# Patient Record
Sex: Female | Born: 1980 | Race: White | Hispanic: No | Marital: Married | Smoking: Never smoker
Health system: Southern US, Community
[De-identification: ages and names within clinical notes are randomized; demographics above are authoritative.]

---

## 2009-05-07 ENCOUNTER — Emergency Department (HOSPITAL_COMMUNITY): Admission: EM | Admit: 2009-05-07 | Discharge: 2009-05-07 | Payer: Self-pay | Admitting: Emergency Medicine

## 2012-04-11 ENCOUNTER — Emergency Department (HOSPITAL_COMMUNITY)
Admission: EM | Admit: 2012-04-11 | Discharge: 2012-04-11 | Disposition: A | Discharge status: Home / Self Care | Payer: Self-pay | Attending: Emergency Medicine | Admitting: Emergency Medicine

## 2012-04-11 ENCOUNTER — Encounter (HOSPITAL_COMMUNITY): Payer: Self-pay | Admitting: *Deleted

## 2012-04-11 DIAGNOSIS — J329 Chronic sinusitis, unspecified: Secondary | ICD-10-CM

## 2012-04-11 DIAGNOSIS — J019 Acute sinusitis, unspecified: Secondary | ICD-10-CM | POA: Insufficient documentation

## 2012-04-11 MED ORDER — GUAIFENESIN-CODEINE 100-10 MG/5ML PO SYRP: 10.0000 mL | ORAL_SOLUTION | Freq: Three times a day (TID) | ORAL | Status: AC | PRN

## 2012-04-11 MED ORDER — AMOXICILLIN 500 MG PO CAPS: 500.0000 mg | ORAL_CAPSULE | Freq: Three times a day (TID) | ORAL | Status: DC

## 2012-04-11 NOTE — ED Notes (Signed)
Sore throat, nasal congestion for 4-5 days.  Alert, NAD No cough

## 2012-04-11 NOTE — ED Provider Notes (Signed)
History     CSN: [id number]  Arrival date & time 04/11/12  1845   First MD Initiated Contact with Patient 04/11/12 1900      Chief Complaint  Patient presents with  . Nasal Congestion    (Consider location/radiation/quality/duration/timing/severity/associated sxs/prior treatment) Patient is a 32 y.o. female presenting with URI. The history is provided by the patient.  URI The primary symptoms include sore throat. Primary symptoms do not include fever, headaches, ear pain, swollen glands, cough, wheezing, abdominal pain, nausea, vomiting, myalgias, arthralgias or rash. Primary symptoms comment: sinus pressure and nasal congestion The current episode started 3 to 5 days ago. This is a new problem. The problem has not changed since onset. The sore throat began yesterday. The sore throat has been unchanged since its onset. The sore throat is mild in intensity. The sore throat is not accompanied by trouble swallowing, drooling, hoarse voice or stridor.  Symptoms associated with the illness include facial pain, sinus pressure, congestion and rhinorrhea. The illness is not associated with chills or plugged ear sensation. The following treatments were addressed: Acetaminophen was not tried. A decongestant was ineffective. Aspirin was not tried. NSAIDs were not tried.    History reviewed. No pertinent past medical history.  History reviewed. No pertinent past surgical history.  No family history on file.  History  Substance Use Topics  . Smoking status: Never Smoker   . Smokeless tobacco: Not on file  . Alcohol Use: Yes     Comment: occasional    OB History    Grav Para Term Preterm Abortions TAB SAB Ect Mult Living                  Review of Systems  Constitutional: Negative for fever, chills, activity change and appetite change.  HENT: Positive for congestion, sore throat, rhinorrhea and sinus pressure. Negative for ear pain, hoarse voice, facial swelling, sneezing, drooling,  trouble swallowing, neck pain and neck stiffness.   Eyes: Negative for visual disturbance.  Respiratory: Negative for cough, chest tightness, shortness of breath, wheezing and stridor.   Gastrointestinal: Negative for nausea, vomiting and abdominal pain.  Musculoskeletal: Negative for myalgias and arthralgias.  Skin: Negative.  Negative for rash.  Neurological: Negative for dizziness, weakness, numbness and headaches.  Hematological: Negative for adenopathy.  Psychiatric/Behavioral: Negative for confusion.  All other systems reviewed and are negative.    Allergies  Review of patient's allergies indicates no known allergies.  Home Medications   Current Outpatient Rx  Name  Route  Sig  Dispense  Refill  . PHENYLEPHRINE-IBUPROFEN 10-200 MG PO TABS   Oral   Take 1-2 tablets by mouth once as needed. For congestion relief         . DAYQUIL/NYQUIL COLD/FLU RELIEF PO   Oral   Take 2 capsules by mouth daily as needed. Cold and congestion relief         . PSEUDOEPHEDRINE-ACETAMINOPHEN 30-325 MG PO TABS   Oral   Take 1-2 tablets by mouth daily as needed. For congestion relief           BP 138/68  Pulse 81  Temp 98.7 F (37.1 C) (Oral)  Resp 20  Ht 5\' 8"  (1.727 m)  Wt 380 lb (172.367 kg)  BMI 57.78 kg/m2  SpO2 99%  LMP 02/10/2012  Physical Exam  Nursing note and vitals reviewed. Constitutional: She is oriented to person, place, and time. She appears well-developed and well-nourished. No distress.  HENT:  Head: Normocephalic and atraumatic.  Right Ear: Tympanic membrane and ear canal normal.  Left Ear: Tympanic membrane and ear canal normal.  Nose: Mucosal edema and rhinorrhea present. Right sinus exhibits maxillary sinus tenderness. Right sinus exhibits no frontal sinus tenderness. Left sinus exhibits maxillary sinus tenderness. Left sinus exhibits no frontal sinus tenderness.  Mouth/Throat: Uvula is midline, oropharynx is clear and moist and mucous membranes are  normal.  Eyes: EOM are normal. Pupils are equal, round, and reactive to light.  Neck: Normal range of motion. Neck supple.  Cardiovascular: Normal rate, normal heart sounds and intact distal pulses.   No murmur heard. Pulmonary/Chest: Breath sounds normal. She is in respiratory distress. She has no wheezes. She has no rales.  Musculoskeletal: Normal range of motion.  Lymphadenopathy:    She has no cervical adenopathy.  Neurological: She is alert and oriented to person, place, and time. She exhibits normal muscle tone. Coordination normal.  Skin: Skin is warm and dry.    ED Course  Procedures (including critical care time)  Labs Reviewed - No data to display No results found.      MDM   Vitals are stable, NAD.  Nasal congestion with ttp of the maxillary sinuses.  No focal neuro deficits, no meningeal signs.    Non-toxic appearing.    Pt agrees to fluids, ibuprofen, and f/u with her PMD  Prescribed: Amoxil Robitussin AC   Pattie Flaharty L. Johneisha Broaden, [place] 04/12/12 0045

## 2012-04-11 NOTE — ED Notes (Signed)
Nasal congestion x 4 days.  Taking OTC meds with no relief.

## 2012-04-12 NOTE — ED Provider Notes (Signed)
Medical screening examination/treatment/procedure(s) were performed by non-physician practitioner and as supervising physician I was immediately available for consultation/collaboration.    Shelda Jakes, MD 04/12/12 1200

## 2012-05-01 ENCOUNTER — Emergency Department (HOSPITAL_COMMUNITY)
Admission: EM | Admit: 2012-05-01 | Discharge: 2012-05-01 | Disposition: A | Discharge status: Home / Self Care | Payer: Self-pay | Attending: Emergency Medicine | Admitting: Emergency Medicine

## 2012-05-01 ENCOUNTER — Encounter (HOSPITAL_COMMUNITY): Payer: Self-pay | Admitting: *Deleted

## 2012-05-01 DIAGNOSIS — Y9389 Activity, other specified: Secondary | ICD-10-CM | POA: Insufficient documentation

## 2012-05-01 DIAGNOSIS — Y929 Unspecified place or not applicable: Secondary | ICD-10-CM | POA: Insufficient documentation

## 2012-05-01 DIAGNOSIS — S025XXA Fracture of tooth (traumatic), initial encounter for closed fracture: Secondary | ICD-10-CM | POA: Insufficient documentation

## 2012-05-01 DIAGNOSIS — X58XXXA Exposure to other specified factors, initial encounter: Secondary | ICD-10-CM | POA: Insufficient documentation

## 2012-05-01 MED ORDER — HYDROCODONE-ACETAMINOPHEN 5-325 MG PO TABS: 2.0000 | ORAL_TABLET | ORAL | Status: DC | PRN

## 2012-05-01 MED ORDER — NAPROXEN 500 MG PO TABS: 500.0000 mg | ORAL_TABLET | Freq: Two times a day (BID) | ORAL | Status: AC

## 2012-05-01 MED ORDER — AMOXICILLIN 500 MG PO CAPS: 500.0000 mg | ORAL_CAPSULE | Freq: Three times a day (TID) | ORAL | Status: AC

## 2012-05-01 NOTE — ED Notes (Signed)
Pt has pain to one of her teeth on the botttom right side of her mouth. Can't see dentist until Tuesday

## 2012-05-01 NOTE — ED Provider Notes (Signed)
History     CSN: [id number]  Arrival date & time 05/01/12  2017   First MD Initiated Contact with Patient 05/01/12 2047      Chief Complaint  Patient presents with  . Dental Pain    (Consider location/radiation/quality/duration/timing/severity/associated sxs/prior treatment) HPI Comments: Pt with cc of dental pain - has had toothache X e days since she was chwing on hard candy and broke her tooth.  Sx were acute in osnet, constant, worse with chewing and sensitivity to temp, not associated with f/c/n/v or swelling of jaw.    Patient is a 32 y.o. female presenting with tooth pain. The history is provided by the patient and the spouse.  Dental PainPrimary symptoms do not include fever or sore throat.  Additional symptoms do not include: facial swelling and trouble swallowing.    History reviewed. No pertinent past medical history.  History reviewed. No pertinent past surgical history.  History reviewed. No pertinent family history.  History  Substance Use Topics  . Smoking status: Never Smoker   . Smokeless tobacco: Not on file  . Alcohol Use: Yes     Comment: occasional    OB History   Grav Para Term Preterm Abortions TAB SAB Ect Mult Living                  Review of Systems  Constitutional: Negative for fever and chills.  HENT: Positive for dental problem. Negative for sore throat, facial swelling, trouble swallowing and voice change.        Toothache  Gastrointestinal: Negative for nausea and vomiting.    Allergies  Review of patient's allergies indicates no known allergies.  Home Medications   Current Outpatient Rx  Name  Route  Sig  Dispense  Refill  . acetaminophen (TYLENOL) 500 MG tablet   Oral   Take 1,000 mg by mouth every 6 (six) hours as needed for pain.         Marland Kitchen ibuprofen (ADVIL,MOTRIN) 200 MG tablet   Oral   Take 400 mg by mouth every 8 (eight) hours as needed for pain.         Marland Kitchen amoxicillin (AMOXIL) 500 MG capsule   Oral   Take 1  capsule (500 mg total) by mouth 3 (three) times daily.   30 capsule   0   . HYDROcodone-acetaminophen (NORCO/VICODIN) 5-325 MG per tablet   Oral   Take 2 tablets by mouth every 4 (four) hours as needed for pain.   10 tablet   0   . naproxen (NAPROSYN) 500 MG tablet   Oral   Take 1 tablet (500 mg total) by mouth 2 (two) times daily with a meal.   30 tablet   0     BP 146/82  Pulse 84  Temp(Src) 98 F (36.7 C) (Oral)  Resp 20  Ht 5\' 7"  (1.702 m)  Wt 380 lb (172.367 kg)  BMI 59.5 kg/m2  SpO2 98%  LMP 02/10/2012  Physical Exam  Nursing note and vitals reviewed. Constitutional: She appears well-developed and well-nourished. No distress.  HENT:  Head: Normocephalic and atraumatic.  Mouth/Throat: Oropharynx is clear and moist. No oropharyngeal exudate.  Dental Disease - missing teeth, has cracked rear molar on the lower R jaw.  Eyes: Conjunctivae are normal. No scleral icterus.  Neck: Normal range of motion. Neck supple. No thyromegaly present.  Cardiovascular: Normal rate and regular rhythm.   Pulmonary/Chest: Effort normal and breath sounds normal.  Lymphadenopathy:    She has  no cervical adenopathy.  Neurological: She is alert.  Skin: Skin is warm and dry. No rash noted. She is not diaphoretic.    ED Course  Procedures (including critical care time)  Labs Reviewed - No data to display No results found.   1. Tooth fracture       MDM  Well appearing, no signs of sig infection, no fever, swallowing without diff, has f/u with dentist in 2 days, declines meds here, wants Rx to go, stable.  Naprosyn hydrocodone        Vida Roller, MD 05/01/12 2055

## 2019-08-24 ENCOUNTER — Encounter (HOSPITAL_COMMUNITY): Payer: Self-pay | Admitting: *Deleted

## 2019-08-24 ENCOUNTER — Emergency Department (HOSPITAL_COMMUNITY): Payer: 59

## 2019-08-24 ENCOUNTER — Emergency Department (HOSPITAL_COMMUNITY)
Admission: EM | Admit: 2019-08-24 | Discharge: 2019-08-24 | Disposition: A | Discharge status: Home / Self Care | Payer: 59 | Attending: Emergency Medicine | Admitting: Emergency Medicine

## 2019-08-24 ENCOUNTER — Other Ambulatory Visit: Payer: Self-pay

## 2019-08-24 DIAGNOSIS — N200 Calculus of kidney: Secondary | ICD-10-CM | POA: Diagnosis not present

## 2019-08-24 DIAGNOSIS — R109 Unspecified abdominal pain: Secondary | ICD-10-CM | POA: Diagnosis present

## 2019-08-24 LAB — CBC WITH DIFFERENTIAL/PLATELET
Abs Immature Granulocytes: 0.08 10*3/uL — ABNORMAL HIGH (ref 0.00–0.07)
Basophils Absolute: 0.1 10*3/uL (ref 0.0–0.1)
Basophils Relative: 1 %
Eosinophils Absolute: 0.2 10*3/uL (ref 0.0–0.5)
Eosinophils Relative: 1 %
HCT: 45.6 % (ref 36.0–46.0)
Hemoglobin: 14.6 g/dL (ref 12.0–15.0)
Immature Granulocytes: 1 %
Lymphocytes Relative: 21 %
Lymphs Abs: 2.7 10*3/uL (ref 0.7–4.0)
MCH: 30 pg (ref 26.0–34.0)
MCHC: 32 g/dL (ref 30.0–36.0)
MCV: 93.6 fL (ref 80.0–100.0)
Monocytes Absolute: 0.9 10*3/uL (ref 0.1–1.0)
Monocytes Relative: 7 %
Neutro Abs: 8.9 10*3/uL — ABNORMAL HIGH (ref 1.7–7.7)
Neutrophils Relative %: 69 %
Platelets: 272 10*3/uL (ref 150–400)
RBC: 4.87 MIL/uL (ref 3.87–5.11)
RDW: 13.6 % (ref 11.5–15.5)
WBC: 12.8 10*3/uL — ABNORMAL HIGH (ref 4.0–10.5)
nRBC: 0 % (ref 0.0–0.2)

## 2019-08-24 LAB — URINALYSIS, ROUTINE W REFLEX MICROSCOPIC
Bacteria, UA: NONE SEEN
Bilirubin Urine: NEGATIVE
Glucose, UA: NEGATIVE mg/dL
Ketones, ur: NEGATIVE mg/dL
Leukocytes,Ua: NEGATIVE
Nitrite: NEGATIVE
Protein, ur: NEGATIVE mg/dL
RBC / HPF: >50 RBC/hpf — ABNORMAL HIGH (ref 0–5)
Specific Gravity, Urine: 1.015 (ref 1.005–1.030)
pH: 5 (ref 5.0–8.0)

## 2019-08-24 LAB — BASIC METABOLIC PANEL
Anion gap: 8 (ref 5–15)
BUN: 14 mg/dL (ref 6–20)
CO2: 29 mmol/L (ref 22–32)
Calcium: 9 mg/dL (ref 8.9–10.3)
Chloride: 100 mmol/L (ref 98–111)
Creatinine, Ser: 0.84 mg/dL (ref 0.44–1.00)
GFR calc Af Amer: >60 mL/min (ref >60)
GFR calc non Af Amer: >60 mL/min (ref >60)
Glucose, Bld: 212 mg/dL — ABNORMAL HIGH (ref 70–99)
Potassium: 4.8 mmol/L (ref 3.5–5.1)
Sodium: 137 mmol/L (ref 135–145)

## 2019-08-24 LAB — PREGNANCY, URINE: Preg Test, Ur: NEGATIVE

## 2019-08-24 MED ORDER — KETOROLAC TROMETHAMINE 60 MG/2ML IM SOLN
30.0000 mg | Freq: Once | INTRAMUSCULAR | Status: AC
  Administered 2019-08-24: 30 mg via INTRAMUSCULAR
  Filled 2019-08-24: qty 2

## 2019-08-24 MED ORDER — IBUPROFEN 600 MG PO TABS: 600.0000 mg | ORAL_TABLET | Freq: Three times a day (TID) | ORAL | 0 refills | Status: AC

## 2019-08-24 MED ORDER — ONDANSETRON 4 MG PO TBDP: 4.0000 mg | ORAL_TABLET | Freq: Three times a day (TID) | ORAL | 0 refills | Status: AC | PRN

## 2019-08-24 MED ORDER — HYDROCODONE-ACETAMINOPHEN 5-325 MG PO TABS: 1.0000 | ORAL_TABLET | ORAL | 0 refills | Status: AC | PRN

## 2019-08-24 NOTE — Discharge Instructions (Addendum)
Drink plenty of fluids Take narcotic pain medicine as needed. Do not drink alcohol or drive while taking this medicine Ibuprofen 600mg  - Take as needed for pain and inflammation. Take with food three times daily Take Zofran for nausea Please follow up with [facility] Return to the ER if symptoms are worsening

## 2019-08-24 NOTE — ED Triage Notes (Signed)
Right flank pain with vomiting onset 0200 today

## 2019-08-24 NOTE — ED Provider Notes (Signed)
[facility] EMERGENCY DEPARTMENT Provider Note   CSN: [id number] Arrival date & time: 08/24/19  1033     History Chief Complaint  Patient presents with  . Flank Pain    Anna Everett is a 39 y.o. female who presents with R flank pain. She states she woke up at 2AM this morning to get ready for work and when she got up she felt an acute onset of severe R flank pain. It is sharp and tight. It sometimes radiates to the abdomen. Nothing makes it better. Movement and palpation of the area make it worse. She thought if she had a BM it would help it feel better and she was able to poop but it did not improve her symptoms. She had 2 episodes of vomiting which were food contents and then bile. She denies fever, chills, current nausea, abdominal pain, urinary symptoms, or vaginal complaints. She denies hx of back pain or prior abdominal surgeries.  HPI     History reviewed. No pertinent past medical history.  There are no problems to display for this patient.   History reviewed. No pertinent surgical history.   OB History   No obstetric history on file.     No family history on file.  Social History   Tobacco Use  . Smoking status: Never Smoker  . Smokeless tobacco: Never Used  Substance Use Topics  . Alcohol use: Yes    Comment: occasional  . Drug use: Yes    Types: Marijuana    Home Medications Prior to Admission medications   Medication Sig Start Date End Date Taking? Authorizing Provider  acetaminophen (TYLENOL) 500 MG tablet Take 1,000 mg by mouth every 6 (six) hours as needed for pain.    [provider]  amoxicillin (AMOXIL) 500 MG capsule Take 1 capsule (500 mg total) by mouth 3 (three) times daily. 05/01/12   Eber Hong, MD  HYDROcodone-acetaminophen (NORCO/VICODIN) 5-325 MG per tablet Take 2 tablets by mouth every 4 (four) hours as needed for pain. 05/01/12   Eber Hong, MD  ibuprofen (ADVIL,MOTRIN) 200 MG tablet Take 400 mg by mouth every 8 (eight) hours  as needed for pain.    [provider]  naproxen (NAPROSYN) 500 MG tablet Take 1 tablet (500 mg total) by mouth 2 (two) times daily with a meal. 05/01/12   Eber Hong, MD    Allergies    Patient has no known allergies.  Review of Systems   Review of Systems  Constitutional: Negative for chills and fever.  Respiratory: Negative for shortness of breath.   Cardiovascular: Negative for chest pain.  Gastrointestinal: Positive for nausea and vomiting. Negative for abdominal pain, constipation and diarrhea.  Genitourinary: Positive for flank pain. Negative for difficulty urinating, dysuria and hematuria.  All other systems reviewed and are negative.   Physical Exam Updated Vital Signs BP 137/61   Pulse 80   Temp 98.1 F (36.7 C)   Resp 18   LMP 08/24/2019   SpO2 98%   Physical Exam Vitals and nursing note reviewed.  Constitutional:      General: She is not in acute distress.    Appearance: She is well-developed. She is obese. She is not ill-appearing.  HENT:     Head: Normocephalic and atraumatic.  Eyes:     General: No scleral icterus.       Right eye: No discharge.        Left eye: No discharge.     Conjunctiva/sclera: Conjunctivae normal.  Pupils: Pupils are equal, round, and reactive to light.  Cardiovascular:     Rate and Rhythm: Normal rate and regular rhythm.  Pulmonary:     Effort: Pulmonary effort is normal. No respiratory distress.     Breath sounds: Normal breath sounds.  Abdominal:     General: There is no distension.     Palpations: Abdomen is soft.     Tenderness: There is no abdominal tenderness. There is right CVA tenderness (mild). There is no left CVA tenderness.  Musculoskeletal:     Cervical back: Normal range of motion.     Comments: No midline back tenderness  Skin:    General: Skin is warm and dry.  Neurological:     Mental Status: She is alert and oriented to person, place, and time.  Psychiatric:        Behavior: Behavior  normal.     ED Results / Procedures / Treatments   Labs (all labs ordered are listed, but only abnormal results are displayed) Labs Reviewed  BASIC METABOLIC PANEL - Abnormal; Notable for the following components:      Result Value   Glucose, Bld 212 (*)    All other components within normal limits  CBC WITH DIFFERENTIAL/PLATELET - Abnormal; Notable for the following components:   WBC 12.8 (*)    Neutro Abs 8.9 (*)    Abs Immature Granulocytes 0.08 (*)    All other components within normal limits  URINALYSIS, ROUTINE W REFLEX MICROSCOPIC - Abnormal; Notable for the following components:   APPearance HAZY (*)    Hgb urine dipstick LARGE (*)    RBC / HPF >50 (*)    All other components within normal limits  PREGNANCY, URINE    EKG None  Radiology CT Renal Stone Study  Result Date: 08/24/2019 CLINICAL DATA:  Right flank pain and emesis EXAM: CT ABDOMEN AND PELVIS WITHOUT CONTRAST TECHNIQUE: Multidetector CT imaging of the abdomen and pelvis was performed following the standard protocol without oral or IV contrast. COMPARISON:  None. FINDINGS: Lower chest: There is mild atelectatic change in the anteromedial right base. Lung bases otherwise are clear. Hepatobiliary: Liver measures 28.6 cm in length. There is diffuse hepatic steatosis. No focal liver lesions are evident on this noncontrast enhanced study. Gallbladder wall is not appreciably thickened. There is no biliary duct dilatation. Pancreas: There is no pancreatic mass or inflammatory focus on either side. Spleen: No splenic lesions are evident. Adrenals/Urinary Tract: Adrenals bilaterally appear normal. Right kidney is subtly edematous with perinephric stranding on the right. There is no renal mass on either side. There is mild hydronephrosis on the right. There is no appreciable hydronephrosis on the left. There is no intrarenal calculus on either side. There is a calculus in the right ureter at the superior right acetabular level  measuring 5 x 3 mm. No other ureteral calculi are evident. Urinary bladder is midline. Urinary bladder is virtually empty with wall thickness within normal limits for nearly empty bladder state. Stomach/Bowel: There is no appreciable bowel wall or mesenteric thickening. No evident bowel obstruction. The terminal ileum appears unremarkable. There is no appreciable free air or portal venous air. Vascular/Lymphatic: There is no abdominal aortic aneurysm. No arterial vascular lesions are evident. Major venous structures appear patent. There is no appreciable adenopathy in the abdomen or pelvis. Reproductive: The uterus is anteverted.  No evident pelvic mass. Other: The appendix appears normal. There is no abscess or ascites in the abdomen or pelvis. Musculoskeletal: No blastic or  lytic bone lesions are evident. No intramuscular or abdominal wall lesions are evident. IMPRESSION: 1. 5 x 3 mm calculus in the distal right ureter at the superior right acetabular level with relatively mild hydronephrosis on the right. 2.  Enlarged liver with hepatic steatosis. 3. No bowel wall thickening or bowel obstruction. No abscess in the abdomen or pelvis. Appendix appears normal. Electronically Signed   By: Lowella Grip III M.D.   On: 08/24/2019 12:27    Procedures Procedures (including critical care time)  Medications Ordered in ED Medications  ketorolac (TORADOL) injection 30 mg (30 mg Intramuscular Given 08/24/19 1121)    ED Course  I have reviewed the triage vital signs and the nursing notes.  Pertinent labs & imaging results that were available during my care of the patient were reviewed by me and considered in my medical decision making (see chart for details).  39 year old female presents with acute onset of R flank pain with N/V since this morning. Her vitals are normal. On exam she is morbidly obese limiting exam. She is comfortable appearing. She has mild R CVA tenderness. Abdomen is soft, benign. Will  obtain labs, UA, CT renal. Suspect MSK vs renal etiology.  CBC shows mild leukocytosis (12.8). BMP shows hyperglycemia (212). Pt states she is not diabetic. UA has large hgb without signs of infection and CT renal shows 5x40mm obstructing distal uretal stone. She was given IM Toradol and states pain is easing off. Advised stay hydrated and take Ibuprofen as needed for pain. She was also given rx for pain medicine and Zofran. She was advised stone will most likely pass however she was instructed to f/u with Urology or return for worsening symptoms. She was also instructed to establish with a PCP to have her glucose rechecked.  MDM Rules/Calculators/A&P                           Final Clinical Impression(s) / ED Diagnoses Final diagnoses:  Kidney stone    Rx / DC Orders ED Discharge Orders    None       Recardo Evangelist, PA-C 08/24/19 1537    Virgel Manifold, MD 08/25/19 [phone]

## 2021-12-18 IMAGING — CT CT RENAL STONE PROTOCOL
2 of 4 series · 16 of 46 positions shown, 18 images · non-contrast
Comparison: None.

CLINICAL DATA: Right flank pain and emesis

EXAM:
CT ABDOMEN AND PELVIS WITHOUT CONTRAST
TECHNIQUE: Multidetector CT imaging of the abdomen and pelvis was performed
following the standard protocol without oral or IV contrast.

[Series 2: axial st · axial · 0.98mm/px · z∈[+744,+1228]mm · 13 of 109 slices shown, 15 images]
[im 6/109  soft-tissue]
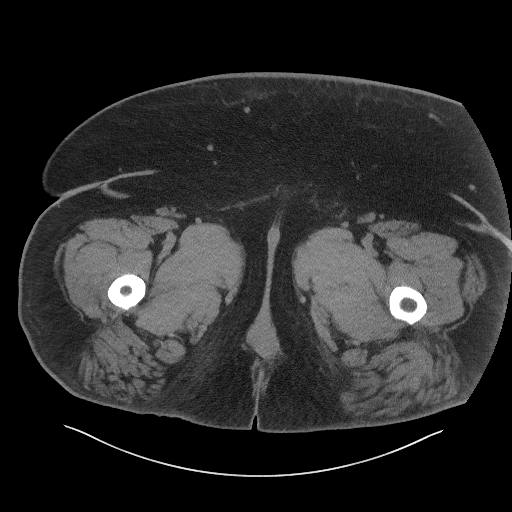
[im 6/109  bone]
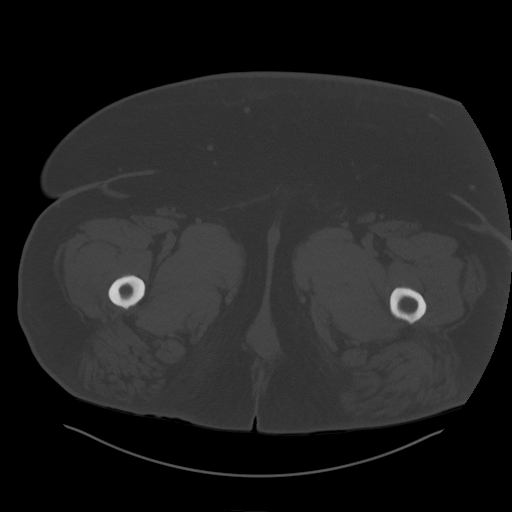
[im 18/109  soft-tissue]
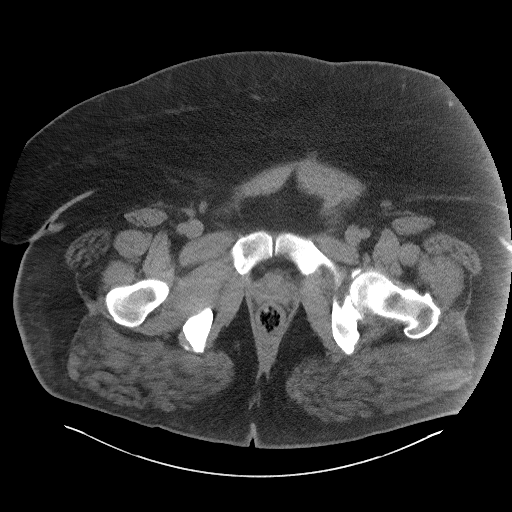
[im 23/109  soft-tissue]
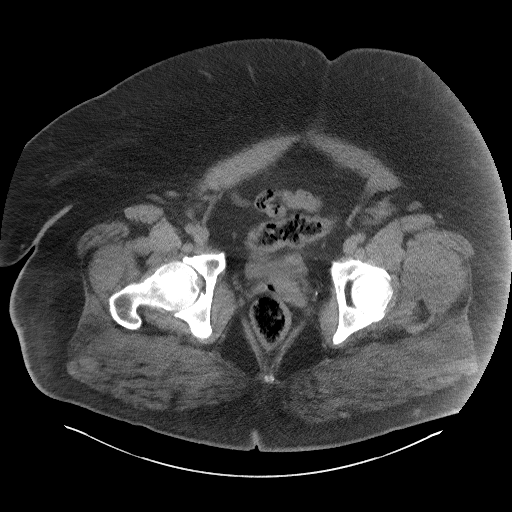
[im 29/109  soft-tissue]
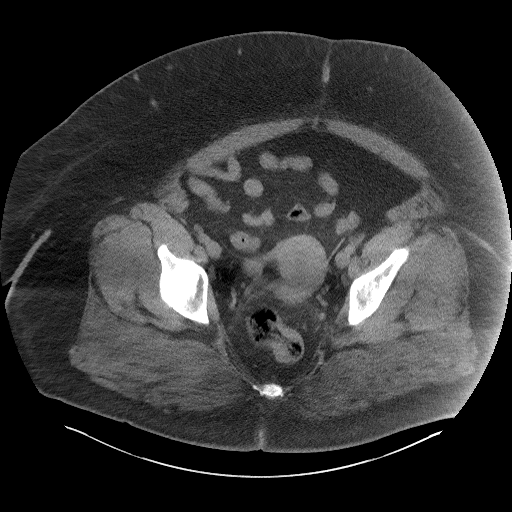
[im 40/109  soft-tissue]
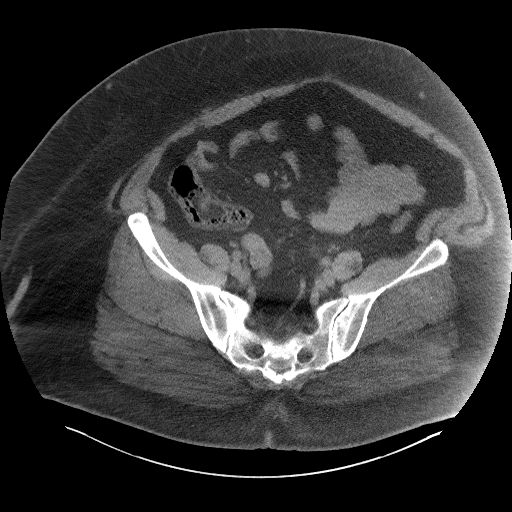
[im 46/109  soft-tissue]
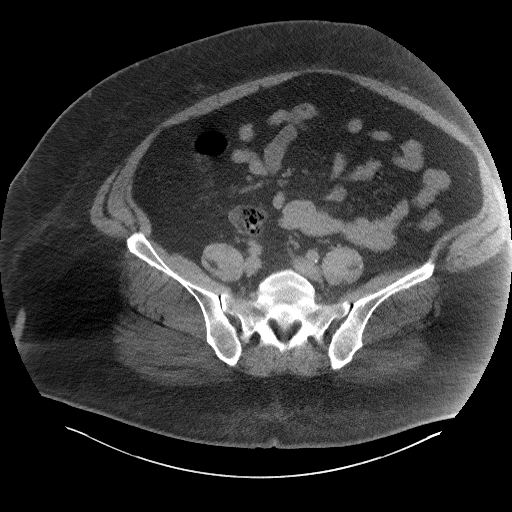
[im 57/109  soft-tissue]
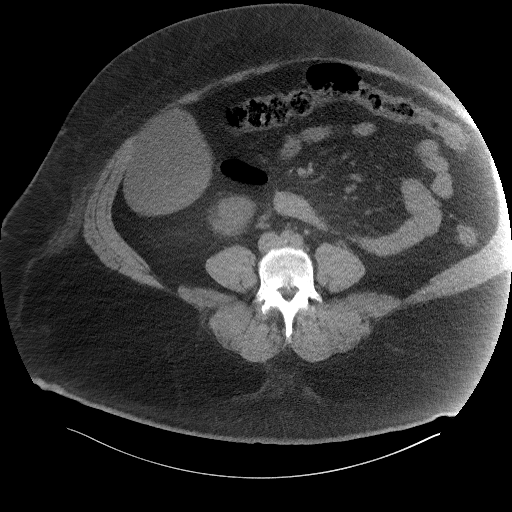
[im 63/109  soft-tissue]
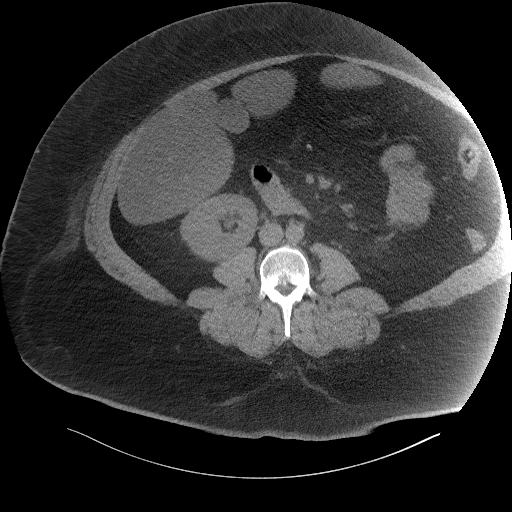
[im 69/109  soft-tissue]
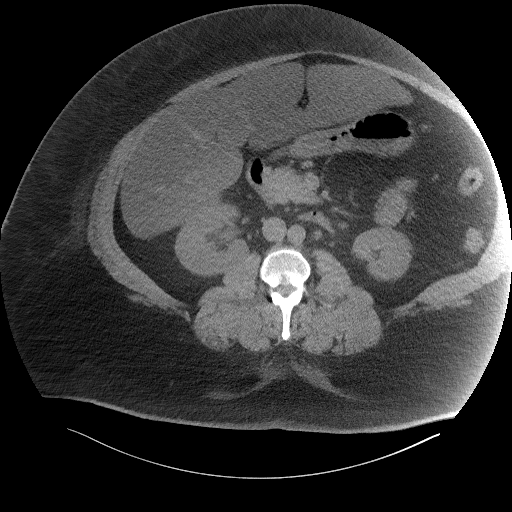
[im 69/109  bone]
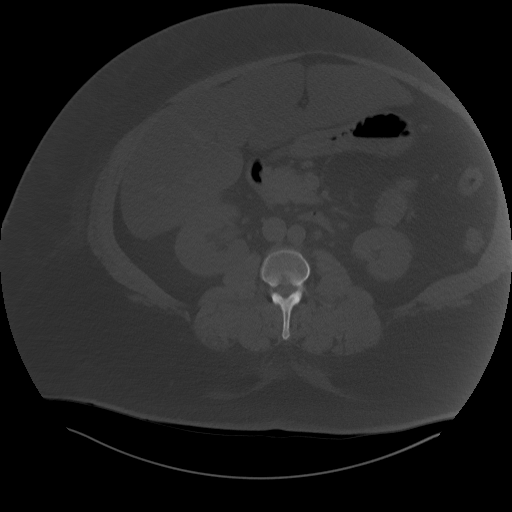
[im 80/109  soft-tissue]
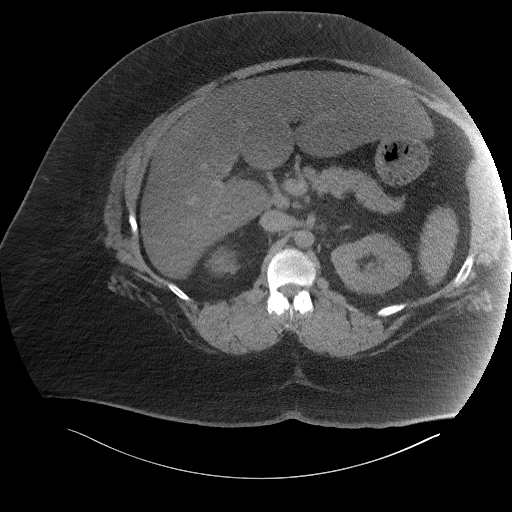
[im 86/109  soft-tissue]
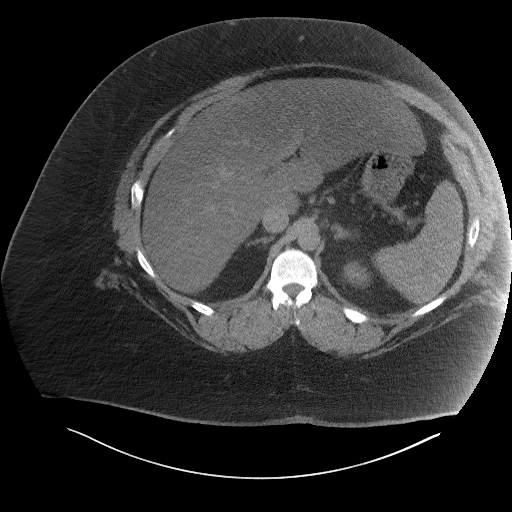
[im 91/109  soft-tissue]
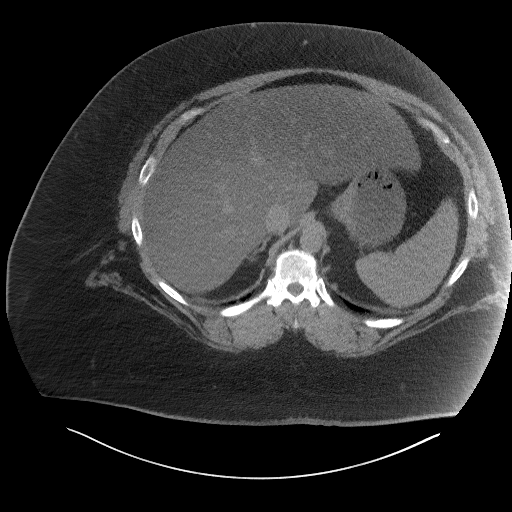
[im 103/109  soft-tissue]
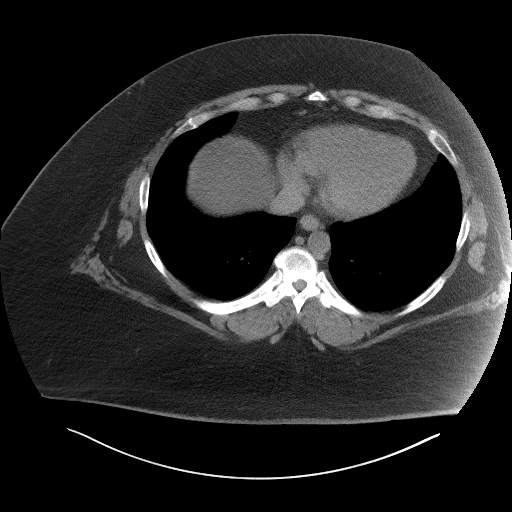

[Series 5: coronal st · coronal · 1.00mm/px · 3 of 147 slices shown]
[im 49/147  soft-tissue]
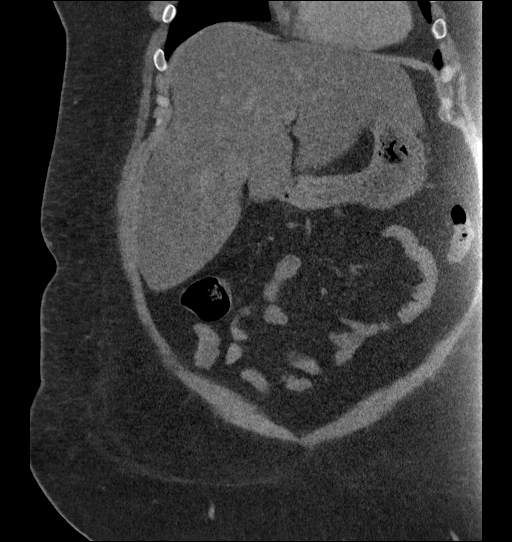
[im 65/147  soft-tissue]
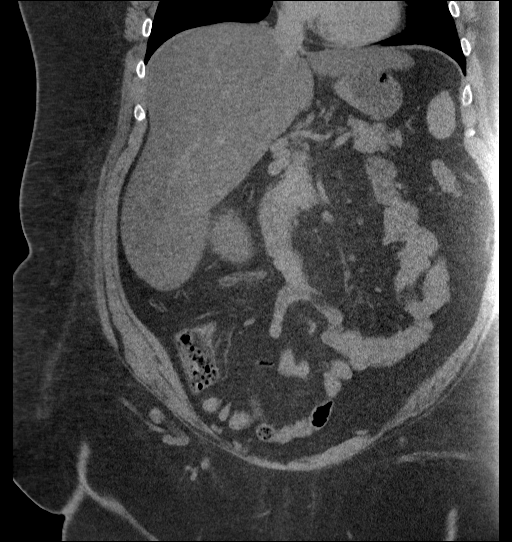
[im 82/147  soft-tissue]
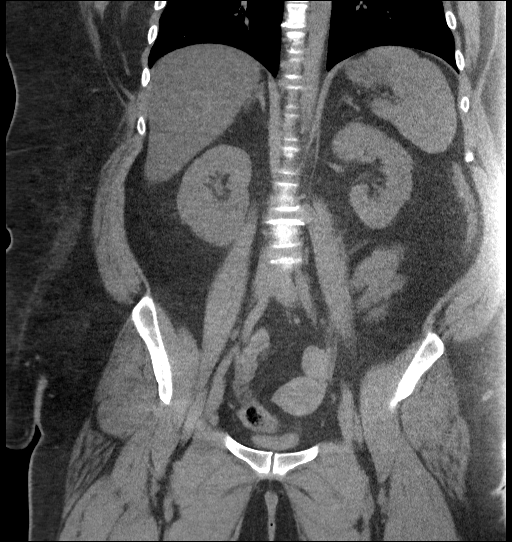

[16 of 46 positions shown; findings below may reference images not displayed]

FINDINGS: Lower chest: There is mild atelectatic change in the anteromedial
right base. Lung bases otherwise are clear.

Hepatobiliary: Liver measures 28.6 cm in length. There is diffuse
hepatic steatosis. No focal liver lesions are evident on this
noncontrast enhanced study. Gallbladder wall is not appreciably
thickened. There is no biliary duct dilatation.

Pancreas: There is no pancreatic mass or inflammatory focus on
either side.

Spleen: No splenic lesions are evident.

Adrenals/Urinary Tract: Adrenals bilaterally appear normal. Right
kidney is subtly edematous with perinephric stranding on the right.
There is no renal mass on either side. There is mild hydronephrosis
on the right. There is no appreciable hydronephrosis on the left.
There is no intrarenal calculus on either side. There is a calculus
in the right ureter at the superior right acetabular level measuring
5 x 3 mm. No other ureteral calculi are evident. Urinary bladder is
midline. Urinary bladder is virtually empty with wall thickness
within normal limits for nearly empty bladder state.

Stomach/Bowel: There is no appreciable bowel wall or mesenteric
thickening. No evident bowel obstruction. The terminal ileum appears
unremarkable. There is no appreciable free air or portal venous air.

Vascular/Lymphatic: There is no abdominal aortic aneurysm. No
arterial vascular lesions are evident. Major venous structures
appear patent. There is no appreciable adenopathy in the abdomen or
pelvis.

Reproductive: The uterus is anteverted.  No evident pelvic mass.

Other: The appendix appears normal. There is no abscess or ascites
in the abdomen or pelvis.

Musculoskeletal: No blastic or lytic bone lesions are evident. No
intramuscular or abdominal wall lesions are evident.
IMPRESSION: 1. 5 x 3 mm calculus in the distal right ureter at the superior
right acetabular level with relatively mild hydronephrosis on the
right.

2.  Enlarged liver with hepatic steatosis.

3. No bowel wall thickening or bowel obstruction. No abscess in the
abdomen or pelvis. Appendix appears normal.
